# Patient Record
Sex: Female | Born: 1980 | Race: Black or African American | Hispanic: No | Marital: Married | Smoking: Never smoker
Health system: Southern US, Community
[De-identification: ages and names within clinical notes are randomized; demographics above are authoritative.]

## PROBLEM LIST (undated history)

## (undated) DIAGNOSIS — Z789 Other specified health status: Secondary | ICD-10-CM

## (undated) HISTORY — PX: NO PAST SURGERIES: SHX2092

---

## 2004-03-25 ENCOUNTER — Other Ambulatory Visit: Admission: RE | Admit: 2004-03-25 | Discharge: 2004-03-25 | Payer: Self-pay | Admitting: Obstetrics and Gynecology

## 2007-07-23 ENCOUNTER — Inpatient Hospital Stay (HOSPITAL_COMMUNITY): Admission: AD | Admit: 2007-07-23 | Discharge: 2007-07-26 | Payer: Self-pay | Admitting: Obstetrics & Gynecology

## 2009-03-13 ENCOUNTER — Inpatient Hospital Stay (HOSPITAL_COMMUNITY): Admission: AD | Admit: 2009-03-13 | Discharge: 2009-03-15 | Payer: Self-pay | Admitting: Obstetrics & Gynecology

## 2010-11-30 NOTE — L&D Delivery Note (Signed)
Delivery Note At 4:22 PM a healthy female was delivered via Vaginal, Spontaneous Delivery (Presentation: Right Occiput Anterior).  APGAR:9 ,9 ; weight 8 lb 2.7 oz (3705 g).   Placenta status: Intact, Spontaneous.  Cord: 3 vessels with the following complications: None.  Cord pH: not done  Anesthesia: Epidural  Episiotomy: None Lacerations: 2nd degree Suture Repair: vicryl rapide Est. Blood Loss (mL): 400  Mom to postpartum.  Baby to nursery-stable.  Christie Copley,MARIE-LYNE 10/20/2011, 4:43 PM

## 2011-03-11 LAB — CBC
HCT: 40.2 % (ref 36.0–46.0)
Hemoglobin: 11.4 g/dL — ABNORMAL LOW (ref 12.0–15.0)
Hemoglobin: 13.9 g/dL (ref 12.0–15.0)
MCHC: 35.4 g/dL (ref 30.0–36.0)
MCV: 100.8 fL — ABNORMAL HIGH (ref 78.0–100.0)
Platelets: 143 10*3/uL — ABNORMAL LOW (ref 150–400)
RBC: 3.2 MIL/uL — ABNORMAL LOW (ref 3.87–5.11)
WBC: 10.2 10*3/uL (ref 4.0–10.5)

## 2011-04-16 LAB — ABO/RH

## 2011-04-16 LAB — RPR: RPR: NONREACTIVE

## 2011-04-16 LAB — HIV ANTIBODY (ROUTINE TESTING W REFLEX): HIV: NONREACTIVE

## 2011-09-11 LAB — CBC
HCT: 31.6 — ABNORMAL LOW
MCHC: 34.7
MCHC: 35.1
MCV: 96.4
MCV: 98.2
Platelets: 129 — ABNORMAL LOW
RBC: 3.85 — ABNORMAL LOW
RDW: 14

## 2011-09-11 LAB — RPR: RPR Ser Ql: NONREACTIVE

## 2011-10-20 ENCOUNTER — Encounter (HOSPITAL_COMMUNITY): Payer: Self-pay | Admitting: *Deleted

## 2011-10-20 ENCOUNTER — Inpatient Hospital Stay (HOSPITAL_COMMUNITY)
Admission: AD | Admit: 2011-10-20 | Discharge: 2011-10-22 | DRG: 775 | Disposition: A | Discharge status: Home / Self Care | Payer: 59 | Source: Ambulatory Visit | Attending: Obstetrics & Gynecology | Admitting: Obstetrics & Gynecology

## 2011-10-20 ENCOUNTER — Inpatient Hospital Stay (HOSPITAL_COMMUNITY): Payer: 59 | Admitting: Anesthesiology

## 2011-10-20 ENCOUNTER — Encounter (HOSPITAL_COMMUNITY): Payer: Self-pay | Admitting: Anesthesiology

## 2011-10-20 DIAGNOSIS — O480 Post-term pregnancy: Principal | ICD-10-CM | POA: Diagnosis present

## 2011-10-20 LAB — CBC
HCT: 36.5 % (ref 36.0–46.0)
Hemoglobin: 12.3 g/dL (ref 12.0–15.0)
MCV: 95.1 fL (ref 78.0–100.0)
Platelets: 167 10*3/uL (ref 150–400)
RBC: 3.84 MIL/uL — ABNORMAL LOW (ref 3.87–5.11)
WBC: 12.2 10*3/uL — ABNORMAL HIGH (ref 4.0–10.5)

## 2011-10-20 MED ORDER — ONDANSETRON HCL 4 MG/2ML IJ SOLN: 4.0000 mg | Freq: Four times a day (QID) | INTRAMUSCULAR | Status: DC | PRN

## 2011-10-20 MED ORDER — DIPHENHYDRAMINE HCL 50 MG/ML IJ SOLN: 12.5000 mg | INTRAMUSCULAR | Status: DC | PRN

## 2011-10-20 MED ORDER — SENNOSIDES-DOCUSATE SODIUM 8.6-50 MG PO TABS
2.0000 | ORAL_TABLET | Freq: Every day | ORAL | Status: DC
  Administered 2011-10-21: 2 via ORAL

## 2011-10-20 MED ORDER — WITCH HAZEL-GLYCERIN EX PADS
1.0000 "application " | MEDICATED_PAD | CUTANEOUS | Status: DC | PRN
  Administered 2011-10-20: 1 via TOPICAL

## 2011-10-20 MED ORDER — SIMETHICONE 80 MG PO CHEW: 80.0000 mg | CHEWABLE_TABLET | ORAL | Status: DC | PRN

## 2011-10-20 MED ORDER — LACTATED RINGERS IV SOLN
500.0000 mL | INTRAVENOUS | Status: DC | PRN
  Administered 2011-10-20: 1000 mL via INTRAVENOUS

## 2011-10-20 MED ORDER — OXYTOCIN 20 UNITS IN LACTATED RINGERS INFUSION - SIMPLE
1.0000 m[IU]/min | INTRAVENOUS | Status: DC
  Administered 2011-10-20: 2 m[IU]/min via INTRAVENOUS

## 2011-10-20 MED ORDER — LACTATED RINGERS IV SOLN
500.0000 mL | Freq: Once | INTRAVENOUS | Status: AC
  Administered 2011-10-20: 500 mL via INTRAVENOUS

## 2011-10-20 MED ORDER — PRENATAL PLUS 27-1 MG PO TABS
1.0000 | ORAL_TABLET | Freq: Every day | ORAL | Status: DC
  Administered 2011-10-21 – 2011-10-22 (×2): 1 via ORAL
  Filled 2011-10-20 (×2): qty 1

## 2011-10-20 MED ORDER — OXYCODONE-ACETAMINOPHEN 5-325 MG PO TABS
1.0000 | ORAL_TABLET | ORAL | Status: DC | PRN
  Administered 2011-10-21: 1 via ORAL
  Filled 2011-10-20: qty 1

## 2011-10-20 MED ORDER — IBUPROFEN 600 MG PO TABS
600.0000 mg | ORAL_TABLET | Freq: Four times a day (QID) | ORAL | Status: DC
  Administered 2011-10-20 – 2011-10-22 (×7): 600 mg via ORAL
  Filled 2011-10-20 (×8): qty 1

## 2011-10-20 MED ORDER — LIDOCAINE HCL 1.5 % IJ SOLN
INTRAMUSCULAR | Status: DC | PRN
  Administered 2011-10-20: 5 mL via INTRADERMAL
  Administered 2011-10-20: 2 mL via INTRADERMAL
  Administered 2011-10-20: 5 mL via INTRADERMAL

## 2011-10-20 MED ORDER — ONDANSETRON HCL 4 MG/2ML IJ SOLN: 4.0000 mg | INTRAMUSCULAR | Status: DC | PRN

## 2011-10-20 MED ORDER — FLEET ENEMA 7-19 GM/118ML RE ENEM: 1.0000 | ENEMA | RECTAL | Status: DC | PRN

## 2011-10-20 MED ORDER — ONDANSETRON HCL 4 MG PO TABS: 4.0000 mg | ORAL_TABLET | ORAL | Status: DC | PRN

## 2011-10-20 MED ORDER — BENZOCAINE-MENTHOL 20-0.5 % EX AERO
INHALATION_SPRAY | CUTANEOUS | Status: AC
  Administered 2011-10-20: 1 via TOPICAL
  Filled 2011-10-20: qty 56

## 2011-10-20 MED ORDER — TERBUTALINE SULFATE 1 MG/ML IJ SOLN: 0.2500 mg | Freq: Once | INTRAMUSCULAR | Status: DC | PRN

## 2011-10-20 MED ORDER — LIDOCAINE HCL (PF) 1 % IJ SOLN
30.0000 mL | INTRAMUSCULAR | Status: DC | PRN
  Filled 2011-10-20: qty 30

## 2011-10-20 MED ORDER — ZOLPIDEM TARTRATE 5 MG PO TABS: 5.0000 mg | ORAL_TABLET | Freq: Every evening | ORAL | Status: DC | PRN

## 2011-10-20 MED ORDER — LANOLIN HYDROUS EX OINT: TOPICAL_OINTMENT | CUTANEOUS | Status: DC | PRN

## 2011-10-20 MED ORDER — EPHEDRINE 5 MG/ML INJ: 10.0000 mg | INTRAVENOUS | Status: DC | PRN

## 2011-10-20 MED ORDER — DIPHENHYDRAMINE HCL 25 MG PO CAPS: 25.0000 mg | ORAL_CAPSULE | Freq: Four times a day (QID) | ORAL | Status: DC | PRN

## 2011-10-20 MED ORDER — CITRIC ACID-SODIUM CITRATE 334-500 MG/5ML PO SOLN: 30.0000 mL | ORAL | Status: DC | PRN

## 2011-10-20 MED ORDER — OXYTOCIN BOLUS FROM INFUSION
500.0000 mL | Freq: Once | INTRAVENOUS | Status: DC
  Filled 2011-10-20: qty 1000
  Filled 2011-10-20: qty 500

## 2011-10-20 MED ORDER — TETANUS-DIPHTH-ACELL PERTUSSIS 5-2.5-18.5 LF-MCG/0.5 IM SUSP
0.5000 mL | Freq: Once | INTRAMUSCULAR | Status: AC
  Administered 2011-10-21: 0.5 mL via INTRAMUSCULAR
  Filled 2011-10-20 (×2): qty 0.5

## 2011-10-20 MED ORDER — LACTATED RINGERS IV SOLN
INTRAVENOUS | Status: DC
  Administered 2011-10-20: 10:00:00 via INTRAVENOUS

## 2011-10-20 MED ORDER — FENTANYL 2.5 MCG/ML BUPIVACAINE 1/10 % EPIDURAL INFUSION (WH - ANES)
14.0000 mL/h | INTRAMUSCULAR | Status: DC
  Administered 2011-10-20: 14 mL/h via EPIDURAL
  Filled 2011-10-20: qty 60

## 2011-10-20 MED ORDER — BENZOCAINE-MENTHOL 20-0.5 % EX AERO
1.0000 "application " | INHALATION_SPRAY | CUTANEOUS | Status: DC | PRN
  Administered 2011-10-20: 1 via TOPICAL
  Filled 2011-10-20: qty 56

## 2011-10-20 MED ORDER — PHENYLEPHRINE 40 MCG/ML (10ML) SYRINGE FOR IV PUSH (FOR BLOOD PRESSURE SUPPORT)
80.0000 ug | PREFILLED_SYRINGE | INTRAVENOUS | Status: DC | PRN
  Filled 2011-10-20: qty 5

## 2011-10-20 MED ORDER — DIBUCAINE 1 % RE OINT
1.0000 "application " | TOPICAL_OINTMENT | RECTAL | Status: DC | PRN
  Administered 2011-10-21: 1 via RECTAL
  Filled 2011-10-20 (×2): qty 28

## 2011-10-20 MED ORDER — EPHEDRINE 5 MG/ML INJ
10.0000 mg | INTRAVENOUS | Status: DC | PRN
  Filled 2011-10-20: qty 4

## 2011-10-20 MED ORDER — PHENYLEPHRINE 40 MCG/ML (10ML) SYRINGE FOR IV PUSH (FOR BLOOD PRESSURE SUPPORT): 80.0000 ug | PREFILLED_SYRINGE | INTRAVENOUS | Status: DC | PRN

## 2011-10-20 MED ORDER — IBUPROFEN 600 MG PO TABS: 600.0000 mg | ORAL_TABLET | Freq: Four times a day (QID) | ORAL | Status: DC | PRN

## 2011-10-20 MED ORDER — OXYCODONE-ACETAMINOPHEN 5-325 MG PO TABS: 2.0000 | ORAL_TABLET | ORAL | Status: DC | PRN

## 2011-10-20 MED ORDER — OXYTOCIN 20 UNITS IN LACTATED RINGERS INFUSION - SIMPLE: 125.0000 mL/h | Freq: Once | INTRAVENOUS | Status: DC

## 2011-10-20 MED ORDER — ACETAMINOPHEN 325 MG PO TABS: 650.0000 mg | ORAL_TABLET | ORAL | Status: DC | PRN

## 2011-10-20 NOTE — Anesthesia Preprocedure Evaluation (Signed)
Anesthesia Evaluation  Patient identified by MRN, date of birth, ID band Patient awake    Reviewed: Allergy & Precautions, H&P , NPO status , Patient's Chart, lab work & pertinent test results, reviewed documented beta blocker date and time   Airway Mallampati: II TM Distance: >3 FB Neck ROM: full    Dental  (+) Teeth Intact   Pulmonary neg pulmonary ROS,  clear to auscultation        Cardiovascular neg cardio ROS regular Normal    Neuro/Psych Negative Neurological ROS  Negative Psych ROS   GI/Hepatic negative GI ROS, Neg liver ROS,   Endo/Other  Negative Endocrine ROS  Renal/GU negative Renal ROS  Genitourinary negative   Musculoskeletal   Abdominal   Peds  Hematology negative hematology ROS (+)   Anesthesia Other Findings   Reproductive/Obstetrics (+) Pregnancy                           Anesthesia Physical Anesthesia Plan  ASA: II  Anesthesia Plan: Epidural   Post-op Pain Management:    Induction:   Airway Management Planned:   Additional Equipment:   Intra-op Plan:   Post-operative Plan:   Informed Consent: I have reviewed the patients History and Physical, chart, labs and discussed the procedure including the risks, benefits and alternatives for the proposed anesthesia with the patient or authorized representative who has indicated his/her understanding and acceptance.     Plan Discussed with:   Anesthesia Plan Comments:         Anesthesia Quick Evaluation

## 2011-10-20 NOTE — H&P (Signed)
Cassandra Bright is a 30 y.o. female [id number] [redacted]w[redacted]d presenting for induction post term.  OB History    Grav Para Term Preterm Abortions TAB SAB Ect Mult Living   3 2 2  0 0 0 0 0 0 2     No past medical history on file. No past surgical history on file. Family History: family history is not on file. Social History:  reports that she has never smoked. She does not have any smokeless tobacco history on file. She reports that she does not drink alcohol or use illicit drugs. Current facility-administered medications:acetaminophen (TYLENOL) tablet 650 mg, 650 mg, Oral, Q4H PRN, Marie-Lyne Hudsyn Barich;  citric acid-sodium citrate (ORACIT) solution 30 mL, 30 mL, Oral, Q2H PRN, Marie-Lyne Kysen Wetherington;  ibuprofen (ADVIL,MOTRIN) tablet 600 mg, 600 mg, Oral, Q6H PRN, Marie-Lyne Khaiden Segreto;  lactated ringers infusion 500-1,000 mL, 500-1,000 mL, Intravenous, PRN, Marie-Lyne Jian Hodgman lactated ringers infusion, , Intravenous, Continuous, Marie-Lyne Jenina Moening;  lidocaine (XYLOCAINE) 1 % injection 30 mL, 30 mL, Subcutaneous, PRN, Marie-Lyne Anntonette Madewell;  ondansetron (ZOFRAN) injection 4 mg, 4 mg, Intravenous, Q6H PRN, Marie-Lyne Carlin Attridge;  oxyCODONE-acetaminophen (PERCOCET) 5-325 MG per tablet 2 tablet, 2 tablet, Oral, Q3H PRN, Marie-Lyne Yarenis Cerino;  oxytocin (PITOCIN) IV BOLUS FROM BAG, 500 mL, Intravenous, Once, Marie-Lyne Georgianne Gritz oxytocin (PITOCIN) IV infusion 20 units in LR 1000 mL, 125 mL/hr, Intravenous, Once, Marie-Lyne Maegen Wigle;  oxytocin (PITOCIN) IV infusion 20 units in LR 1000 mL, 1-40 milli-units/min, Intravenous, Titrated, Marie-Lyne Luxe Cuadros;  sodium phosphate (FLEET) 7-19 GM/118ML enema 1 enema, 1 enema, Rectal, PRN, Marie-Lyne Louden Houseworth;  terbutaline (BRETHINE) injection 0.25 mg, 0.25 mg, Subcutaneous, Once PRN, Marie-Lyne Roselynn Whitacre No Known Allergies    Blood pressure 109/70, pulse 90, resp. rate 20, height 5\' 7"  (1.702 m), weight 73.029 kg (161 lb).  VE 4/90/Vtx/0 membranes intact  HPP: There is no problem list on file for this  patient.   Prenatal labs: ABO, Rh: O/Positive/-- (05/17 0000) Antibody: Negative (05/17 0000) Rubella:  Imm RPR: Nonreactive (05/17 0000)  HBsAg: Negative (05/17 0000)  HIV: Non-reactive (05/17 0000)  Genetic testing: Declined [place] anato: wnl 1 hr GTT: wnl GBS: Negative (10/11 0000)   Assessment/Plan: 41 2/7 wks G3P2 normal pregnancy.  GBS neg.  Advanced cervical dilatation. For induction post dates Pito low dose/AROM/monitoring.   Myer Bohlman,MARIE-LYNE 10/20/2011, 11:09 AM

## 2011-10-20 NOTE — Anesthesia Procedure Notes (Addendum)
Epidural Patient location during procedure: OB Start time: 10/20/2011 2:14 PM Reason for block: procedure for pain  Staffing Performed by: anesthesiologist   Preanesthetic Checklist Completed: patient identified, site marked, surgical consent, pre-op evaluation, timeout performed, IV checked, risks and benefits discussed and monitors and equipment checked  Epidural Patient position: sitting Prep: site prepped and draped and DuraPrep Patient monitoring: continuous pulse ox and blood pressure Approach: midline Injection technique: LOR air  Needle:  Needle type: Tuohy  Needle gauge: 17 G Needle length: 9 cm Catheter type: closed end flexible Catheter size: 19 Gauge Test dose: negative  Assessment Events: blood not aspirated, injection not painful, no injection resistance, negative IV test and no paresthesia  Additional Notes Discussed risk of headache, infection, bleeding, nerve injury and failed or incomplete block.  Patient voices understanding and wishes to proceed.

## 2011-10-20 NOTE — Progress Notes (Signed)
Feeling minor ctx, no LOF, no VB  History briefly reveiwed. G3P2 >41 wks, proven 8'9, no medical problems, GBS neg, no complications in this preg,  Toco: irreg, pit at 2 munits/ min FH: 140's, + accels, no decels, 10 beat var Cvx: 4/80%/ vtx -3, AROM clear, blood tinged  A/P: IOL post-dates. Cont pitocin FWB reasurring  Camber Ninh A. 10/20/2011 12:08 PM

## 2011-10-21 ENCOUNTER — Encounter (HOSPITAL_COMMUNITY): Payer: Self-pay

## 2011-10-21 LAB — CBC
MCH: 32.3 pg (ref 26.0–34.0)
MCHC: 33.6 g/dL (ref 30.0–36.0)
MCV: 96.1 fL (ref 78.0–100.0)
Platelets: 162 10*3/uL (ref 150–400)
RDW: 14.1 % (ref 11.5–15.5)

## 2011-10-21 NOTE — Progress Notes (Signed)
Patient ID: Lodema Hong, female   DOB: 1981-11-24, 30 y.o.   MRN: [id number] PPD 1 SVD  S:  Reports feeling well., mod perineal pain             Tolerating po/ No nausea or vomiting             Bleeding is moderate             Pain controlled withprescription NSAID's including motrin, declines percocet             Up ad lib / ambulatory  Newborn  Information for the patient's newborn:  Nabi, Girl Jerriyah [[id number]]  female  breast and bottle feeding , experienced mom, BF x 21 and 18 mo older children O:  A & O x 3 NAD             VS: Blood pressure 64/53, pulse 98, temperature 98.3 F (36.8 C), temperature source Oral, resp. rate 20, height 5\' 7"  (1.702 m), weight 73.029 kg (161 lb), SpO2 98.00%, unknown if currently breastfeeding.  LABS:  Basename 10/21/11 0517 10/20/11 1030  HGB 10.7* 12.3  HCT 31.8* 36.5    I&O: I/O last 3 completed shifts: In: -  Out: 400 [Blood:400]      Lungs: Clear and unlabored  Heart: regular rate and rhythm / no mumurs  Abdomen: soft, non-tender, non-distended              Fundus: firm, non-tender, U@  Perineum: repair intact, small hemorrhoid  Lochia: small  Extremities: no edema, no calf pain or tenderness, neg Homans    A/P: PPD # 1 30 y.o., [id number] S/P:spontaneous vaginal   Principal Problem:  *Postpartum care following vaginal delivery (11/20)   Doing well - stable status  Routine post partum orders  Anticipate discharge home in AM.   PAUL,DANIELA, CNM, MSN 10/21/2011, 8:47 AM

## 2011-10-21 NOTE — Anesthesia Postprocedure Evaluation (Signed)
  Anesthesia Post-op Note  Patient: Cassandra Bright  Procedure(s) Performed: * No procedures listed *  Patient Location:   Anesthesia Type: Epidural  Level of Consciousness: awake, alert  and oriented  Airway and Oxygen Therapy: Patient Spontanous Breathing  Post-op Pain: mild  Post-op Assessment: Post-op Vital signs reviewed, Patient's Cardiovascular Status Stable, No headache, No backache, No residual numbness and No residual motor weakness  Post-op Vital Signs: Reviewed and stable  Complications: No apparent anesthesia complications

## 2011-10-22 MED ORDER — IBUPROFEN 600 MG PO TABS: 600.0000 mg | ORAL_TABLET | Freq: Four times a day (QID) | ORAL | Status: AC

## 2011-10-22 NOTE — Discharge Summary (Signed)
Obstetric Discharge Summary Reason for Admission: onset of labor Prenatal Procedures: none Intrapartum Procedures: spontaneous vaginal delivery Postpartum Procedures: none Complications-Operative and Postpartum: none Hemoglobin  Date Value Range Status  10/21/2011 10.7* 12.0-15.0 (g/dL) Final     HCT  Date Value Range Status  10/21/2011 31.8* 36.0-46.0 (%) Final    Discharge Diagnoses: Term Pregnancy-delivered  Discharge Information: Date: 10/22/2011 Activity: pelvic rest Diet: routine Medications: PNV and Ibuprofen Condition: stable Instructions: refer to practice specific booklet Discharge to: home   Newborn Data: Live born female  Birth Weight: 8 lb 2.7 oz (3705 g) APGAR: ,   Home with mother.  Cassandra Bright 10/22/2011, 10:27 AM

## 2011-10-22 NOTE — Progress Notes (Signed)
Patient ID: Cassandra Bright, female   DOB: [birth year], 30 y.o.   MRN: [id number]  PPD 2 SVD  S:  Reports feeling well             Tolerating po/ No nausea or vomiting             Bleeding is light             Pain controlled withprescription NSAID's including motrin and percocet             Up ad lib / ambulatory  Newborn breast feeding  / female newborn   O:  A & O x 3              VS: Blood pressure 103/70, pulse 92, temperature 98.3 F (36.8 C), temperature source Oral, resp. rate 18, height 5\' 7"  (1.702 m), weight 73.029 kg (161 lb), SpO2 98.00%, unknown if currently breastfeeding.  LABS: Lab Results  Component Value Date   WBC 15.1* 10/21/2011   HGB 10.7* 10/21/2011   HCT 31.8* 10/21/2011   MCV 96.1 10/21/2011   PLT 162 10/21/2011     Lungs: Clear and unlabored  Heart: regular rate and rhythm / no mumurs  Abdomen: soft, non-tender, non-distended              Fundus: firm, non-tender, U-2  Perineum: mild edema  Lochia: light  Extremities: no edema, no calf pain or tenderness    A: PPD # 2   Doing well - stable status  P:  Routine post partum orders  Discharge home  [facility] 10/22/2011, 9:24 AM

## 2011-11-02 ENCOUNTER — Encounter (HOSPITAL_COMMUNITY): Payer: Self-pay | Admitting: *Deleted

## 2012-11-30 NOTE — L&D Delivery Note (Signed)
Delivery Note At 12:13 PM a healthy female was delivered via Vaginal, Spontaneous Delivery (Presentation: Left Occiput Anterior).  APGAR: 9, 9; weight .   Placenta status: Intact, Spontaneous.  Cord: 3 vessels with the following complications: None.  Cord pH: not done  Anesthesia: Epidural  Episiotomy: None Lacerations: 1st degree;Perineal Suture Repair: vicryl rapide 4-0 Est. Blood Loss (mL):  300 Count of sponges complete.  Mom to postpartum.  Baby to nursery-stable. Circ consent obtained and signed.  BB had a miction.    Lynn Sissel,MARIE-LYNE 07/27/2013, 12:29 PM

## 2013-07-27 ENCOUNTER — Encounter (HOSPITAL_COMMUNITY): Payer: Self-pay | Admitting: *Deleted

## 2013-07-27 ENCOUNTER — Inpatient Hospital Stay (HOSPITAL_COMMUNITY): Payer: 59 | Admitting: Anesthesiology

## 2013-07-27 ENCOUNTER — Inpatient Hospital Stay (HOSPITAL_COMMUNITY)
Admission: AD | Admit: 2013-07-27 | Discharge: 2013-07-28 | DRG: 775 | Disposition: A | Discharge status: Home / Self Care | Payer: 59 | Source: Ambulatory Visit | Attending: Obstetrics and Gynecology | Admitting: Obstetrics and Gynecology

## 2013-07-27 ENCOUNTER — Encounter (HOSPITAL_COMMUNITY): Payer: Self-pay | Admitting: Anesthesiology

## 2013-07-27 HISTORY — DX: Other specified health status: Z78.9

## 2013-07-27 LAB — RPR: RPR Ser Ql: NONREACTIVE

## 2013-07-27 LAB — CBC
MCHC: 34.4 g/dL (ref 30.0–36.0)
RDW: 13.8 % (ref 11.5–15.5)

## 2013-07-27 LAB — OB RESULTS CONSOLE RPR: RPR: NONREACTIVE

## 2013-07-27 LAB — OB RESULTS CONSOLE HIV ANTIBODY (ROUTINE TESTING): HIV: NONREACTIVE

## 2013-07-27 MED ORDER — FENTANYL 2.5 MCG/ML BUPIVACAINE 1/10 % EPIDURAL INFUSION (WH - ANES)
INTRAMUSCULAR | Status: DC | PRN
  Administered 2013-07-27: 14 mL/h via EPIDURAL

## 2013-07-27 MED ORDER — DIBUCAINE 1 % RE OINT: 1.0000 "application " | TOPICAL_OINTMENT | RECTAL | Status: DC | PRN

## 2013-07-27 MED ORDER — IBUPROFEN 600 MG PO TABS
600.0000 mg | ORAL_TABLET | Freq: Four times a day (QID) | ORAL | Status: DC
  Administered 2013-07-27 – 2013-07-28 (×5): 600 mg via ORAL
  Filled 2013-07-27 (×6): qty 1

## 2013-07-27 MED ORDER — ONDANSETRON HCL 4 MG/2ML IJ SOLN: 4.0000 mg | INTRAMUSCULAR | Status: DC | PRN

## 2013-07-27 MED ORDER — PHENYLEPHRINE 40 MCG/ML (10ML) SYRINGE FOR IV PUSH (FOR BLOOD PRESSURE SUPPORT): 80.0000 ug | PREFILLED_SYRINGE | INTRAVENOUS | Status: DC | PRN

## 2013-07-27 MED ORDER — PHENYLEPHRINE 40 MCG/ML (10ML) SYRINGE FOR IV PUSH (FOR BLOOD PRESSURE SUPPORT)
PREFILLED_SYRINGE | INTRAVENOUS | Status: AC
  Filled 2013-07-27: qty 5

## 2013-07-27 MED ORDER — LACTATED RINGERS IV SOLN
500.0000 mL | Freq: Once | INTRAVENOUS | Status: AC
  Administered 2013-07-27: 500 mL via INTRAVENOUS

## 2013-07-27 MED ORDER — LIDOCAINE HCL (PF) 1 % IJ SOLN
30.0000 mL | INTRAMUSCULAR | Status: DC | PRN
  Filled 2013-07-27: qty 30

## 2013-07-27 MED ORDER — ACETAMINOPHEN 325 MG PO TABS: 650.0000 mg | ORAL_TABLET | ORAL | Status: DC | PRN

## 2013-07-27 MED ORDER — CITRIC ACID-SODIUM CITRATE 334-500 MG/5ML PO SOLN: 30.0000 mL | ORAL | Status: DC | PRN

## 2013-07-27 MED ORDER — PRENATAL MULTIVITAMIN CH
1.0000 | ORAL_TABLET | Freq: Every day | ORAL | Status: DC
  Administered 2013-07-27 – 2013-07-28 (×2): 1 via ORAL
  Filled 2013-07-27 (×2): qty 1

## 2013-07-27 MED ORDER — OXYCODONE-ACETAMINOPHEN 5-325 MG PO TABS: 1.0000 | ORAL_TABLET | ORAL | Status: DC | PRN

## 2013-07-27 MED ORDER — LANOLIN HYDROUS EX OINT: TOPICAL_OINTMENT | CUTANEOUS | Status: DC | PRN

## 2013-07-27 MED ORDER — LIDOCAINE HCL (PF) 1 % IJ SOLN
INTRAMUSCULAR | Status: DC | PRN
  Administered 2013-07-27 (×2): 4 mL

## 2013-07-27 MED ORDER — WITCH HAZEL-GLYCERIN EX PADS: 1.0000 "application " | MEDICATED_PAD | CUTANEOUS | Status: DC | PRN

## 2013-07-27 MED ORDER — DIPHENHYDRAMINE HCL 25 MG PO CAPS: 25.0000 mg | ORAL_CAPSULE | Freq: Four times a day (QID) | ORAL | Status: DC | PRN

## 2013-07-27 MED ORDER — OXYTOCIN BOLUS FROM INFUSION
500.0000 mL | INTRAVENOUS | Status: DC
  Administered 2013-07-27: 500 mL via INTRAVENOUS

## 2013-07-27 MED ORDER — SIMETHICONE 80 MG PO CHEW: 80.0000 mg | CHEWABLE_TABLET | ORAL | Status: DC | PRN

## 2013-07-27 MED ORDER — LACTATED RINGERS IV SOLN
INTRAVENOUS | Status: DC
  Administered 2013-07-27 (×2): via INTRAVENOUS

## 2013-07-27 MED ORDER — OXYTOCIN 40 UNITS IN LACTATED RINGERS INFUSION - SIMPLE MED
62.5000 mL/h | INTRAVENOUS | Status: DC
  Filled 2013-07-27: qty 1000

## 2013-07-27 MED ORDER — IBUPROFEN 600 MG PO TABS: 600.0000 mg | ORAL_TABLET | Freq: Four times a day (QID) | ORAL | Status: DC | PRN

## 2013-07-27 MED ORDER — OXYTOCIN 40 UNITS IN LACTATED RINGERS INFUSION - SIMPLE MED: 62.5000 mL/h | INTRAVENOUS | Status: DC | PRN

## 2013-07-27 MED ORDER — ZOLPIDEM TARTRATE 5 MG PO TABS: 5.0000 mg | ORAL_TABLET | Freq: Every evening | ORAL | Status: DC | PRN

## 2013-07-27 MED ORDER — EPHEDRINE 5 MG/ML INJ: 10.0000 mg | INTRAVENOUS | Status: DC | PRN

## 2013-07-27 MED ORDER — EPHEDRINE 5 MG/ML INJ
INTRAVENOUS | Status: AC
  Filled 2013-07-27: qty 4

## 2013-07-27 MED ORDER — FLEET ENEMA 7-19 GM/118ML RE ENEM: 1.0000 | ENEMA | RECTAL | Status: DC | PRN

## 2013-07-27 MED ORDER — DIPHENHYDRAMINE HCL 50 MG/ML IJ SOLN
12.5000 mg | INTRAMUSCULAR | Status: DC | PRN
Start: 2013-07-27 — End: 2013-07-27

## 2013-07-27 MED ORDER — FENTANYL 2.5 MCG/ML BUPIVACAINE 1/10 % EPIDURAL INFUSION (WH - ANES)
14.0000 mL/h | INTRAMUSCULAR | Status: DC | PRN
  Administered 2013-07-27: 14 mL/h via EPIDURAL
  Filled 2013-07-27: qty 125

## 2013-07-27 MED ORDER — ONDANSETRON HCL 4 MG/2ML IJ SOLN: 4.0000 mg | Freq: Four times a day (QID) | INTRAMUSCULAR | Status: DC | PRN

## 2013-07-27 MED ORDER — TETANUS-DIPHTH-ACELL PERTUSSIS 5-2.5-18.5 LF-MCG/0.5 IM SUSP: 0.5000 mL | Freq: Once | INTRAMUSCULAR | Status: DC

## 2013-07-27 MED ORDER — FENTANYL 2.5 MCG/ML BUPIVACAINE 1/10 % EPIDURAL INFUSION (WH - ANES)
INTRAMUSCULAR | Status: AC
  Filled 2013-07-27: qty 125

## 2013-07-27 MED ORDER — BENZOCAINE-MENTHOL 20-0.5 % EX AERO: 1.0000 "application " | INHALATION_SPRAY | CUTANEOUS | Status: DC | PRN

## 2013-07-27 MED ORDER — SENNOSIDES-DOCUSATE SODIUM 8.6-50 MG PO TABS
2.0000 | ORAL_TABLET | Freq: Every day | ORAL | Status: DC
  Administered 2013-07-27: 2 via ORAL

## 2013-07-27 MED ORDER — LACTATED RINGERS IV SOLN: 500.0000 mL | INTRAVENOUS | Status: DC | PRN

## 2013-07-27 MED ORDER — ONDANSETRON HCL 4 MG PO TABS: 4.0000 mg | ORAL_TABLET | ORAL | Status: DC | PRN

## 2013-07-27 NOTE — Anesthesia Preprocedure Evaluation (Signed)

## 2013-07-27 NOTE — Anesthesia Postprocedure Evaluation (Signed)
  Anesthesia Post-op Note  Patient: Cassandra Bright  Procedure(s) Performed: * No procedures listed *  Patient Location: Mother/Baby  Anesthesia Type:Epidural  Level of Consciousness: awake  Airway and Oxygen Therapy: Patient Spontanous Breathing  Post-op Pain: mild  Post-op Assessment: Patient's Cardiovascular Status Stable and Respiratory Function Stable  Post-op Vital Signs: stable  Complications: No apparent anesthesia complications

## 2013-07-27 NOTE — Anesthesia Procedure Notes (Signed)
Epidural Patient location during procedure: OB Start time: 07/27/2013 8:16 AM  Staffing Anesthesiologist: Cliford Sequeira A. Performed by: anesthesiologist   Preanesthetic Checklist Completed: patient identified, site marked, surgical consent, pre-op evaluation, timeout performed, IV checked, risks and benefits discussed and monitors and equipment checked  Epidural Patient position: sitting Prep: site prepped and draped and DuraPrep Patient monitoring: continuous pulse ox and blood pressure Approach: midline Injection technique: LOR air  Needle:  Needle type: Tuohy  Needle gauge: 17 G Needle length: 9 cm and 9 Needle insertion depth: 4 cm Catheter type: closed end flexible Catheter size: 19 Gauge Catheter at skin depth: 9 cm Test dose: negative  Assessment Events: blood not aspirated, injection not painful, no injection resistance, negative IV test and no paresthesia  Additional Notes Patient identified. Risks and benefits discussed including failed block, incomplete  Pain control, post dural puncture headache, nerve damage, paralysis, blood pressure Changes, nausea, vomiting, reactions to medications-both toxic and allergic and post Partum back pain. All questions were answered. Patient expressed understanding and wished to proceed. Sterile technique was used throughout procedure. Epidural site was Dressed with sterile barrier dressing. No paresthesias, signs of intravascular injection Or signs of intrathecal spread were encountered.  Patient was more comfortable after the epidural was dosed. Please see RN's note for documentation of vital signs and FHR which are stable.

## 2013-07-27 NOTE — MAU Note (Signed)
Contractions 5-6 mins apart since 9 pm.

## 2013-07-27 NOTE — MAU Provider Note (Signed)
Cassandra Bright is a 32 y.o. female presenting for active labor. History OB History   Grav Para Term Preterm Abortions TAB SAB Ect Mult Living   4 3 3  0 0 0 0 0 0 3     Past Medical History  Diagnosis Date  . Postpartum care following vaginal delivery (11/20) 10/21/2011   No past surgical history on file. Family History: see prenatal Social History:  reports that she has never smoked. She does not have any smokeless tobacco history on file. She reports that she does not drink alcohol or use illicit drugs.   Prenatal Transfer Tool  Maternal Diabetes: No Genetic Screening: Normal Maternal Ultrasounds/Referrals: Normal Fetal Ultrasounds or other Referrals:  None Maternal Substance Abuse:  No Significant Maternal Medications:  None Significant Maternal Lab Results:  None Other Comments:  None  ROS: neg NCAT Neck : supple Lgs: clear CV: RRR Abd: Gravid, NT Ext: nl Skin: intact  Dilation: 6 Effacement (%): 70 Station: -1 Exam by:: L. Munford RN Blood pressure 111/73, pulse 103, temperature 98.3 F (36.8 C), temperature source Oral, resp. rate 20, height 5\' 7"  (1.702 m), weight 76.204 kg (168 lb), unknown if currently breastfeeding. Exam Physical Exam  Prenatal labs:- see attached prenatal ABO, Rh:   Antibody:   Rubella:   RPR:    HBsAg:    HIV:    GBS:     Assessment/Plan: Active labor at term Epidural Expect SVD   Peggyann Zwiefelhofer J 07/27/2013, 7:20 AM

## 2013-07-28 LAB — CBC
HCT: 34.7 % — ABNORMAL LOW (ref 36.0–46.0)
Hemoglobin: 11.6 g/dL — ABNORMAL LOW (ref 12.0–15.0)
MCH: 31.5 pg (ref 26.0–34.0)
MCV: 94.3 fL (ref 78.0–100.0)
RBC: 3.68 MIL/uL — ABNORMAL LOW (ref 3.87–5.11)

## 2013-07-28 MED ORDER — IBUPROFEN 600 MG PO TABS: 600.0000 mg | ORAL_TABLET | Freq: Four times a day (QID) | ORAL | Status: DC

## 2013-07-28 NOTE — Progress Notes (Signed)
PPD #1- SVD  Subjective:   Reports feeling good Tolerating po/ No nausea or vomiting Bleeding is light Pain controlled with Motrin Up ad lib / ambulatory / voiding without problems Newborn: breastfeeding  / Circumcision: planning   Objective:   WU:JWJX:  [97.8 F (36.6 C)-99.8 F (37.7 C)] 97.8 F (36.6 C) (08/29 0535) Pulse Rate:  [79-97] 80 (08/29 0535) Resp:  [16-18] 16 (08/29 0535) BP: (92-123)/(51-76) 103/60 mmHg (08/29 0535) SpO2:  [99 %] 99 % (08/28 2340)  LABS:  Recent Labs  07/27/13 0715 07/28/13 0615  WBC 13.0* 12.3*  HGB 11.5* 11.6*  PLT 172 181   Blood type:  O pos Rubella:   Immune  I&O: Intake/Output     08/28 0701 - 08/29 0700 08/29 0701 - 08/30 0700   Blood 300    Total Output 300     Net -300            Physical Exam: Alert and oriented x3 Abdomen: soft, non-tender, non-distended  Fundus: firm, non-tender, U-1 Perineum: Well approximated, no significant erythema, edema, or drainage; healing well. Lochia: small Extremities: no edema, no calf pain or tenderness    Assessment:  PPD # [id number]/ S/P:spontaneous vaginal, 1st degree laceration  Doing well    Plan: Continue routine post partum orders May plan for early discharge later today   Donette Larry, N MSN, CNM 07/28/2013, 9:43 AM

## 2013-07-28 NOTE — Discharge Summary (Signed)
Obstetric Discharge Summary Reason for Admission: onset of labor Prenatal Procedures: none Intrapartum Procedures: spontaneous vaginal delivery and epidural Postpartum Procedures: none Complications-Operative and Postpartum: 1st degree perineal laceration Hemoglobin  Date Value Range Status  07/28/2013 11.6* 12.0 - 15.0 g/dL Final     HCT  Date Value Range Status  07/28/2013 34.7* 36.0 - 46.0 % Final    Physical Exam:  General: alert and cooperative Lochia: appropriate Uterine Fundus: firm Incision: healing well, no significant drainage, no dehiscence, no significant erythema DVT Evaluation: No evidence of DVT seen on physical exam. Negative Homan's sign.  Discharge Diagnoses: Term Pregnancy-delivered  Discharge Information: Date: 07/28/2013 Activity: pelvic rest Diet: routine Medications: PNV and Ibuprofen Condition: stable Instructions: refer to practice specific booklet Discharge to: home Follow-up Information   Follow up with LAVOIE,MARIE-LYNE, MD. Schedule an appointment as soon as possible for a visit in 6 weeks.   Specialty:  Obstetrics and Gynecology   Contact information:   [address] [place] [address] [phone]       Newborn Data: Live born female on 07/27/13 Birth Weight: 7 lb 3.2 oz (3266 g) APGAR: 9, 9  Home with mother.  Cassandra Bright, N 07/28/2013, 5:56 PM

## 2014-10-01 ENCOUNTER — Encounter (HOSPITAL_COMMUNITY): Payer: Self-pay | Admitting: *Deleted

## 2018-06-03 ENCOUNTER — Other Ambulatory Visit: Payer: Self-pay

## 2018-06-03 ENCOUNTER — Encounter (HOSPITAL_BASED_OUTPATIENT_CLINIC_OR_DEPARTMENT_OTHER): Payer: Self-pay

## 2018-06-03 ENCOUNTER — Emergency Department (HOSPITAL_BASED_OUTPATIENT_CLINIC_OR_DEPARTMENT_OTHER)
Admission: EM | Admit: 2018-06-03 | Discharge: 2018-06-03 | Disposition: A | Discharge status: Home / Self Care | Payer: 59 | Attending: Emergency Medicine | Admitting: Emergency Medicine

## 2018-06-03 ENCOUNTER — Emergency Department (HOSPITAL_BASED_OUTPATIENT_CLINIC_OR_DEPARTMENT_OTHER): Payer: 59

## 2018-06-03 DIAGNOSIS — Y998 Other external cause status: Secondary | ICD-10-CM | POA: Diagnosis not present

## 2018-06-03 DIAGNOSIS — S62102A Fracture of unspecified carpal bone, left wrist, initial encounter for closed fracture: Secondary | ICD-10-CM

## 2018-06-03 DIAGNOSIS — W11XXXA Fall on and from ladder, initial encounter: Secondary | ICD-10-CM | POA: Insufficient documentation

## 2018-06-03 DIAGNOSIS — S52572A Other intraarticular fracture of lower end of left radius, initial encounter for closed fracture: Secondary | ICD-10-CM | POA: Insufficient documentation

## 2018-06-03 DIAGNOSIS — Y929 Unspecified place or not applicable: Secondary | ICD-10-CM | POA: Insufficient documentation

## 2018-06-03 DIAGNOSIS — Y9389 Activity, other specified: Secondary | ICD-10-CM | POA: Insufficient documentation

## 2018-06-03 DIAGNOSIS — Z79899 Other long term (current) drug therapy: Secondary | ICD-10-CM | POA: Insufficient documentation

## 2018-06-03 DIAGNOSIS — S6992XA Unspecified injury of left wrist, hand and finger(s), initial encounter: Secondary | ICD-10-CM | POA: Diagnosis present

## 2018-06-03 MED ORDER — HYDROCODONE-ACETAMINOPHEN 5-325 MG PO TABS: 1.0000 | ORAL_TABLET | Freq: Four times a day (QID) | ORAL | 0 refills | Status: AC | PRN

## 2018-06-03 MED ORDER — HYDROCODONE-ACETAMINOPHEN 5-325 MG PO TABS
2.0000 | ORAL_TABLET | Freq: Once | ORAL | Status: AC
  Administered 2018-06-03: 2 via ORAL
  Filled 2018-06-03: qty 2

## 2018-06-03 MED ORDER — IBUPROFEN 600 MG PO TABS: 600.0000 mg | ORAL_TABLET | Freq: Four times a day (QID) | ORAL | 0 refills | Status: AC | PRN

## 2018-06-03 NOTE — ED Provider Notes (Signed)
[facility] EMERGENCY DEPARTMENT Provider Note   CSN: [id number] Arrival date & time: 06/03/18  1853     History   Chief Complaint Chief Complaint  Patient presents with  . Wrist Injury    HPI Cassandra Bright is a 37 y.o. female who is previously healthy who presents with left wrist and hand pain after falling off a ladder.  Patient was helping her husband and handing him something up to the roof when she slipped and fell.  She caught her fall with her left wrist.  She describes a fall on outstretched hand.  She does not remember hitting her head or losing consciousness.  She denies any neck or back pain, or lower extremity pain.  No medications or interventions taken prior to arrival. Patient is right handed.  HPI  Past Medical History:  Diagnosis Date  . Medical history non-contributory   . Postpartum care following vaginal delivery (11/20) 10/21/2011  . Postpartum care following vaginal delivery (8/28) 07/27/2013    Patient Active Problem List   Diagnosis Date Noted  . Postpartum care following vaginal delivery (8/28) 07/27/2013    Past Surgical History:  Procedure Laterality Date  . NO PAST SURGERIES       OB History    Gravida  4   Para  4   Term  4   Preterm  0   AB  0   Living  4     SAB  0   TAB  0   Ectopic  0   Multiple  0   Live Births  2            Home Medications    Prior to Admission medications   Medication Sig Start Date End Date Taking? Authorizing Provider  HYDROcodone-acetaminophen (NORCO/VICODIN) 5-325 MG tablet Take 1-2 tablets by mouth every 6 (six) hours as needed. 06/03/18   Monti Jilek, Waylan BogaAlexandra M, PA-C  ibuprofen (ADVIL,MOTRIN) 600 MG tablet Take 1 tablet (600 mg total) by mouth every 6 (six) hours as needed. 06/03/18   Emi HolesLaw, Koleton Duchemin M, PA-C  Prenatal Vit-Fe Fumarate-FA (PRENATAL MULTIVITAMIN) TABS tablet Take 1 tablet by mouth daily at 12 noon.    [provider]    Family History No family history on  file.  Social History Social History   Tobacco Use  . Smoking status: Never Smoker  . Smokeless tobacco: Never Used  Substance Use Topics  . Alcohol use: No  . Drug use: No     Allergies   Patient has no known allergies.   Review of Systems Review of Systems  Musculoskeletal: Positive for arthralgias.  Skin: Negative for wound.  Neurological: Negative for syncope and numbness.     Physical Exam Updated Vital Signs BP 106/67 (BP Location: Left Arm)   Pulse 82   Temp 98.2 F (36.8 C) (Oral)   Resp 16   Ht 5\' 7"  (1.702 m)   Wt 59 kg (130 lb)   LMP 05/24/2018   SpO2 100%   BMI 20.36 kg/m   Physical Exam  Constitutional: She is oriented to person, place, and time. She appears well-developed and well-nourished. No distress.  HENT:  Head: Normocephalic and atraumatic.  Eyes: Conjunctivae are normal. Right eye exhibits no discharge. Left eye exhibits no discharge. No scleral icterus.  Cardiovascular: Normal rate, regular rhythm, normal heart sounds and intact distal pulses. Exam reveals no gallop and no friction rub.  No murmur heard. Pulmonary/Chest: Effort normal. No stridor. No respiratory distress. She  has no wheezes.  Musculoskeletal:  Swelling and deformity noted to left wrist with tenderness on the radial aspect to distal third of the forearm, anatomical snuffbox, as well as the MCP of the left thumb; sensation intact of the digits, full range of motion, no tenderness to the elbow or shoulder extending; radial pulse intact, cap refill less than 2 seconds No tenderness on palpation of remaining extremities, cervical, thoracic, or lumbar spine  Neurological: She is alert and oriented to person, place, and time. Coordination normal.  Skin: Skin is warm and dry. No rash noted. She is not diaphoretic. No pallor.  Psychiatric: She has a normal mood and affect. Her behavior is normal. Judgment and thought content normal.  Nursing note and vitals reviewed.    ED  Treatments / Results  Labs (all labs ordered are listed, but only abnormal results are displayed) Labs Reviewed - No data to display  EKG None  Radiology Dg Wrist Complete Left  Result Date: 06/03/2018 CLINICAL DATA:  Larey Seat from ladder pain to the left wrist EXAM: LEFT WRIST - COMPLETE 3+ VIEW COMPARISON:  None. FINDINGS: Acute comminuted intra-articular fracture of the distal radius. Minimal dorsal displacement of distal fracture fragments. Fracture is impacted. No dislocation. IMPRESSION: Acute comminuted, impacted intra-articular distal radius fracture Electronically Signed   By: Jasmine Pang M.D.   On: 06/03/2018 19:29   Dg Hand Complete Left  Result Date: 06/03/2018 CLINICAL DATA:  Larey Seat off ladder EXAM: LEFT HAND - COMPLETE 3+ VIEW COMPARISON:  None. FINDINGS: Acute impacted intra-articular distal radius fracture. No acute fracture or malalignment within the digits of the hand. Soft tissue swelling about the wrist. IMPRESSION: Acute intra-articular distal radius fracture Electronically Signed   By: Jasmine Pang M.D.   On: 06/03/2018 19:30    Procedures Procedures (including critical care time) SPLINT APPLICATION Date/Time: 7:38 PM Authorized by: Emi Holes Consent: Verbal consent obtained. Risks and benefits: risks, benefits and alternatives were discussed Consent given by: patient Splint applied by: EMT Location details: L wrist Splint type: volar short arm Supplies used: orthoglass, ACE wrap Post-procedure: The splinted body part was neurovascularly unchanged following the procedure. Patient tolerance: Patient tolerated the procedure well with no immediate complications.    Medications Ordered in ED Medications  HYDROcodone-acetaminophen (NORCO/VICODIN) 5-325 MG per tablet 2 tablet (2 tablets Oral Given 06/03/18 1923)     Initial Impression / Assessment and Plan / ED Course  I have reviewed the triage vital signs and the nursing notes.  Pertinent labs & imaging  results that were available during my care of the patient were reviewed by me and considered in my medical decision making (see chart for details).     Patient with acute comminuted, impacted intra-articular LEFT distal radius fracture seen on x-ray. X-rays also reviewed by me. Patient is neurovascularly intact.  Patient splinted in the ED.  Will refer to hand surgery, Dr. Melvyn Novas, for further evaluation.  Will discharge home with short course of Norco and ibuprofen for pain control.  I reviewed the [place] narcotic database and found no discrepancies.  Patient understands and agrees with plan.  Patient vitals stable throughout ED course and discharged in satisfactory condition.  Final Clinical Impressions(s) / ED Diagnoses   Final diagnoses:  Closed fracture of left wrist, initial encounter    ED Discharge Orders        Ordered    HYDROcodone-acetaminophen (NORCO/VICODIN) 5-325 MG tablet  Every 6 hours PRN     06/03/18 1932  ibuprofen (ADVIL,MOTRIN) 600 MG tablet  Every 6 hours PRN     06/03/18 1932       Emi Holes, PA-C 06/03/18 Bishop Limbo, MD 06/07/18 1234

## 2018-06-03 NOTE — ED Triage Notes (Signed)
Pt injured left wrist from fall from ladder-denies head/neck pain/injury-cardboard splint applied by EMT

## 2018-06-03 NOTE — ED Notes (Signed)
Swelling and pain to left wrist after falling from a ladder, able to move all fingers, radial pulse is strong.

## 2018-06-03 NOTE — Discharge Instructions (Addendum)
Medications: Norco, ibuprofen  Treatment: Take ibuprofen every 6 hours as prescribed.  For breakthrough pain, take 1-2 Norco every 6 hours.  You can use ice through your  splint 3-4 times daily alternating 20 minutes on, 20 minutes off.  Keep your splint applied until you see the hand surgeon.  Make sure he does not get wet.  Please return to the emergency department if it does get wet for replacement.  Follow-up: Please follow-up with Dr. Melvyn Novasrtmann, the hand doctor, for further evaluation and treatment of your fracture.  Please return the emergency department if you develop any new or worsening symptoms.  Do not drink alcohol, drive, operate machinery or participate in any other potentially dangerous activities while taking opiate pain medication as it may make you sleepy. Do not take this medication with any other sedating medications, either prescription or over-the-counter. If you were prescribed Percocet or Vicodin, do not take these with acetaminophen (Tylenol) as it is already contained within these medications and overdose of Tylenol is dangerous.   This medication is an opiate (or narcotic) pain medication and can be habit forming.  Use it as little as possible to achieve adequate pain control.  Do not use or use it with extreme caution if you have a history of opiate abuse or dependence. This medication is intended for your use only - do not give any to anyone else and keep it in a secure place where nobody else, especially children, have access to it. It will also cause or worsen constipation, so you may want to consider taking an over-the-counter stool softener while you are taking this medication.

## 2019-01-19 IMAGING — CR DG HAND COMPLETE 3+V*L*
3 series · 3 of 3 positions shown · non-contrast
Comparison: None.

CLINICAL DATA: Fell off ladder

EXAM:
LEFT HAND - COMPLETE 3+ VIEW

[x hand pa left]
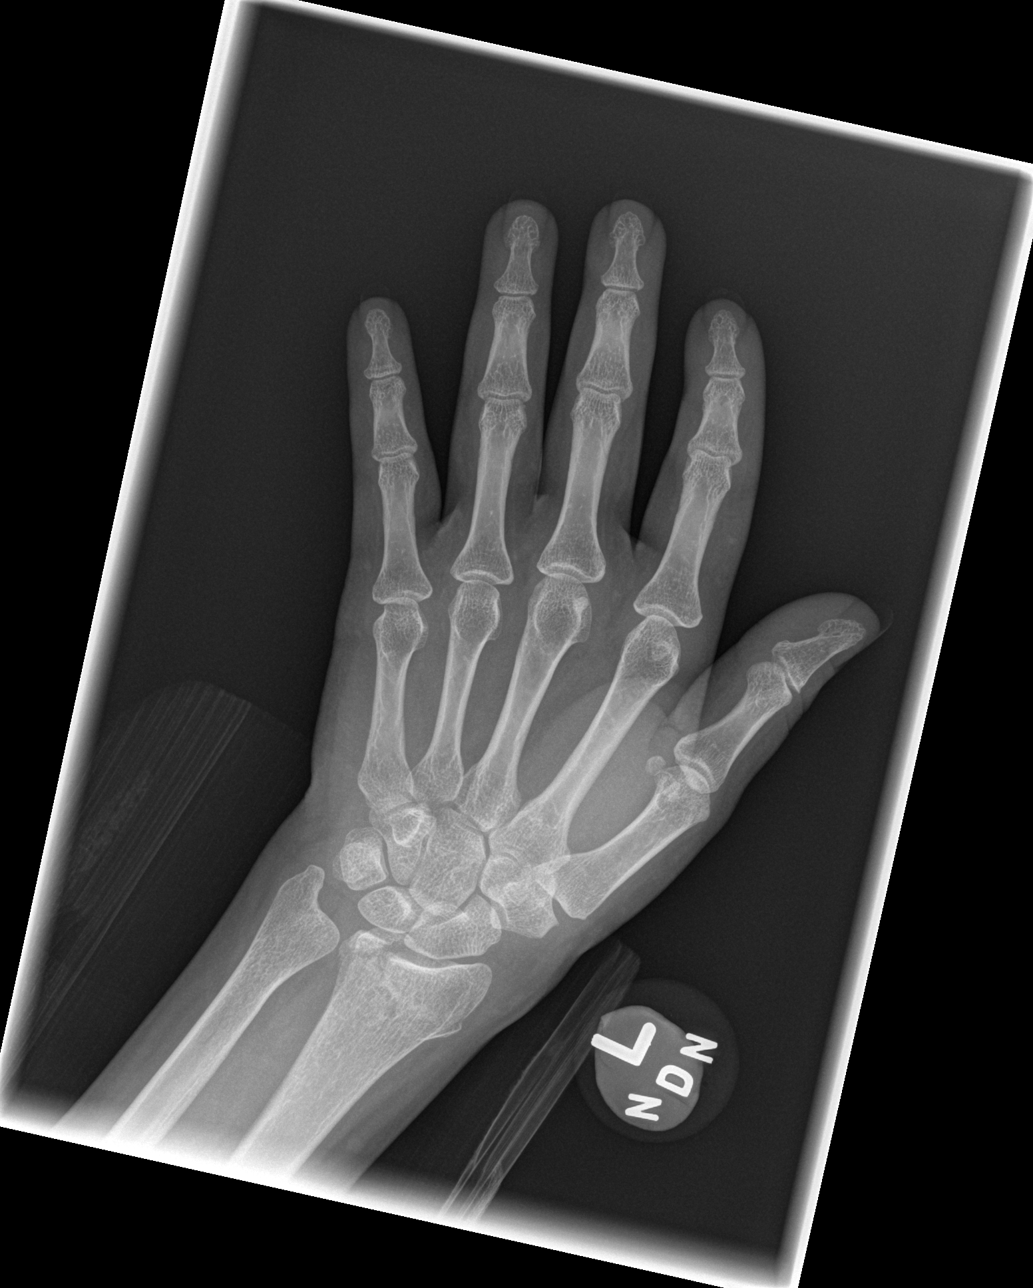

[x hand oblique left]
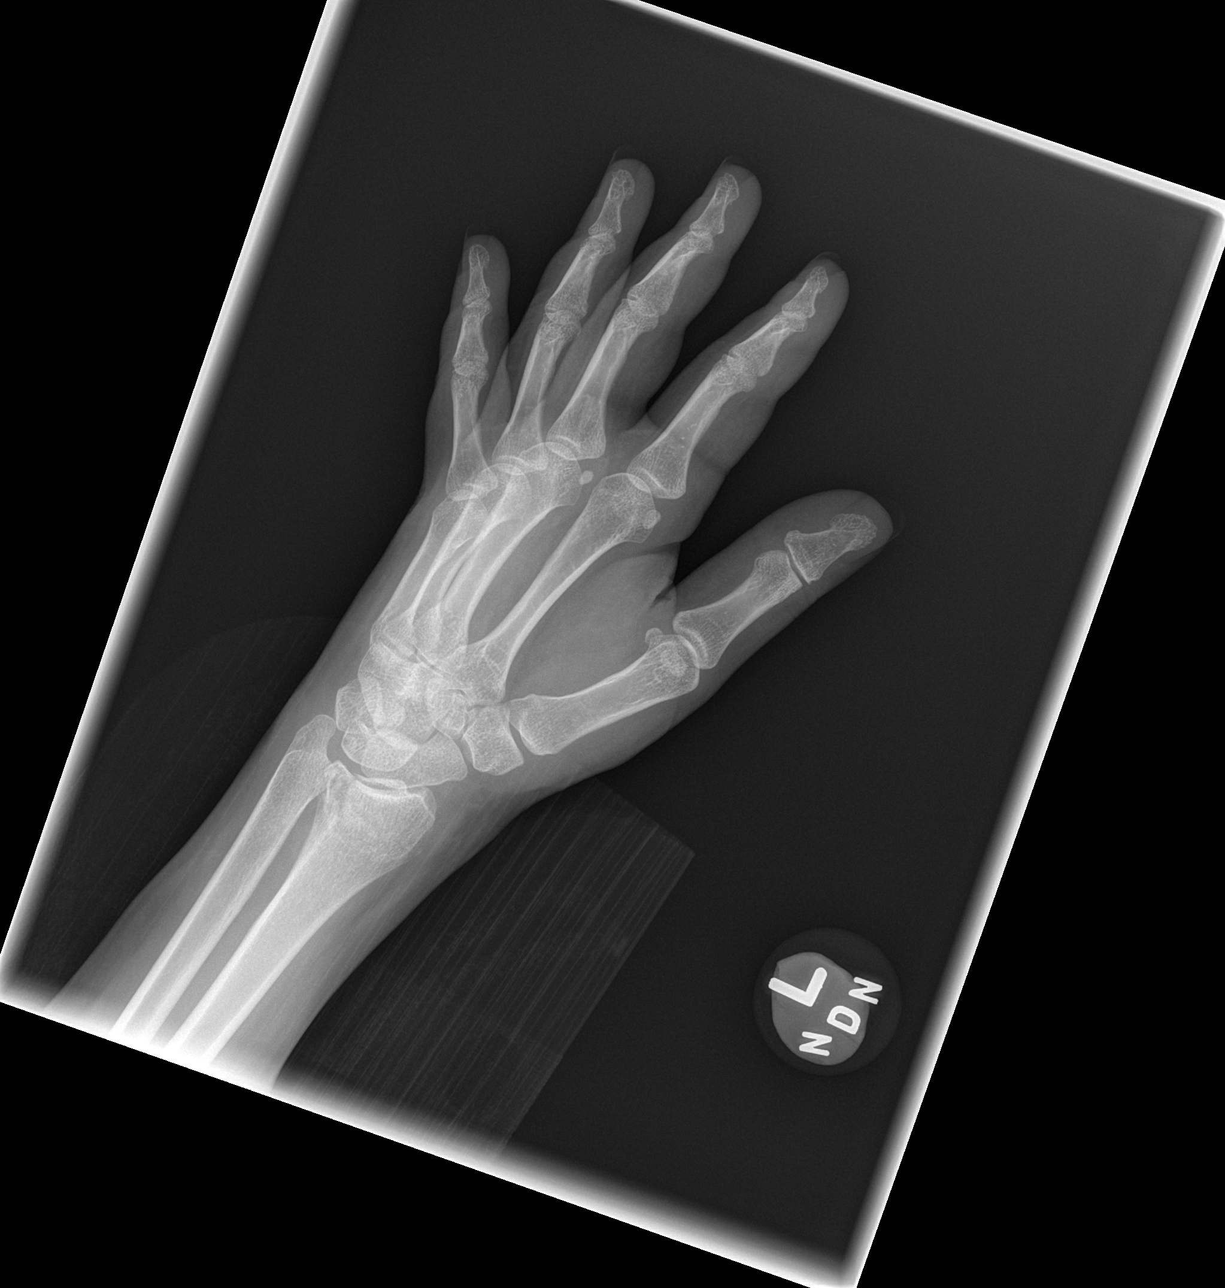

[x hand lat left]
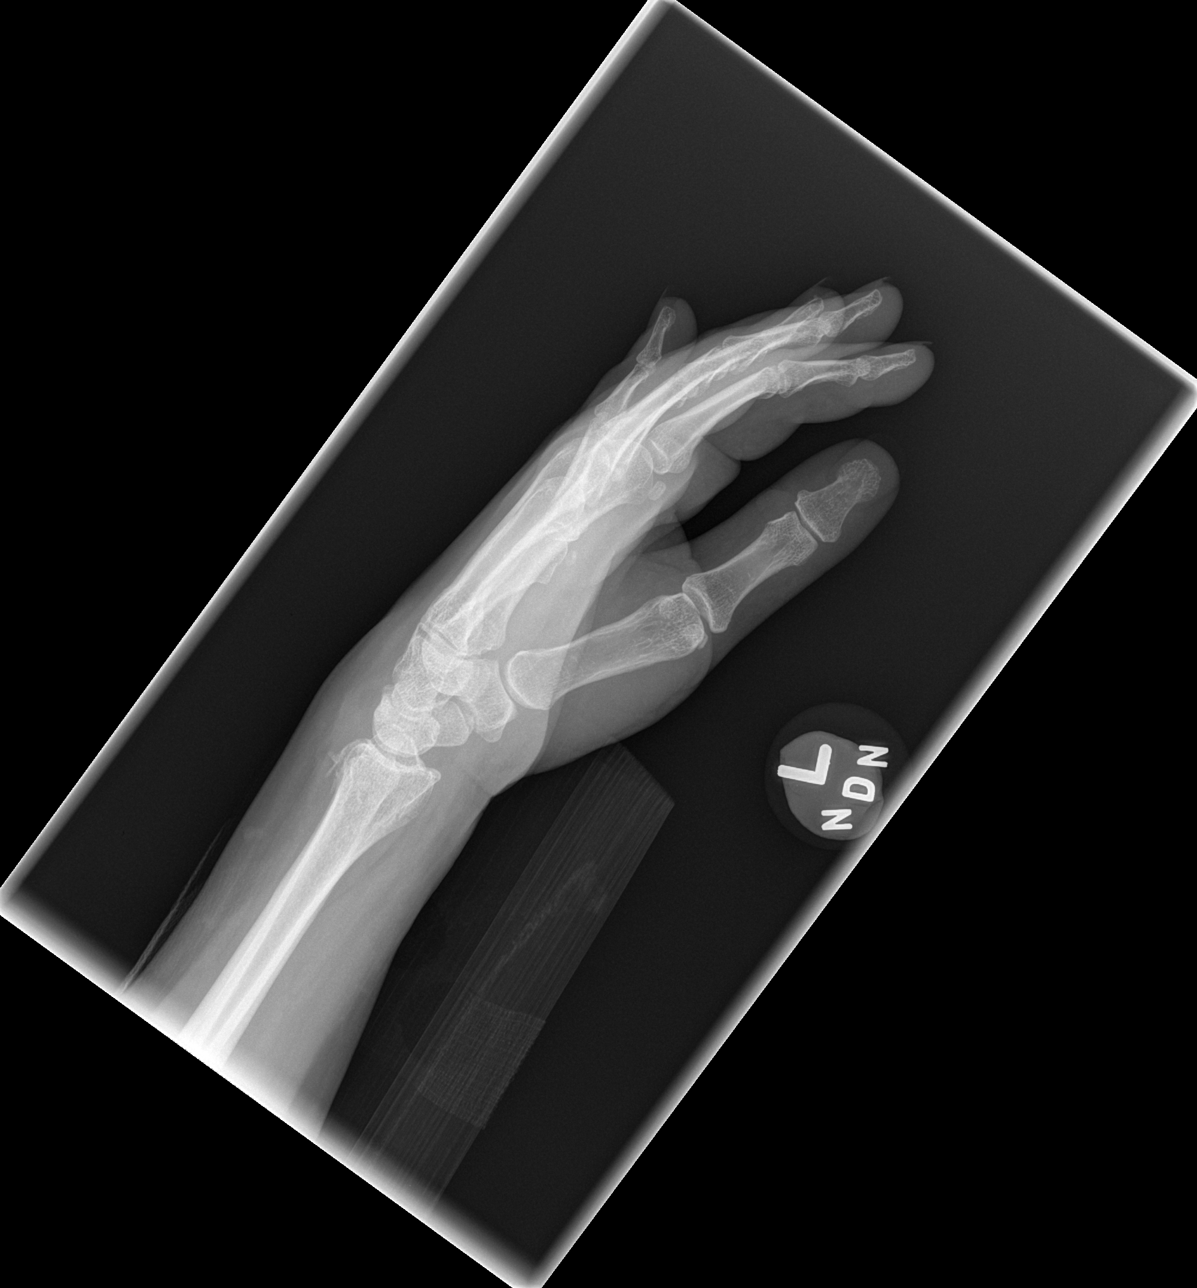

[3 of 3 positions shown; findings below may reference images not displayed]

FINDINGS: Acute impacted intra-articular distal radius fracture. No acute
fracture or malalignment within the digits of the hand. Soft tissue
swelling about the wrist.
IMPRESSION: Acute intra-articular distal radius fracture

## 2021-05-04 ENCOUNTER — Encounter (HOSPITAL_BASED_OUTPATIENT_CLINIC_OR_DEPARTMENT_OTHER): Payer: Self-pay | Admitting: Emergency Medicine

## 2021-05-04 ENCOUNTER — Emergency Department (HOSPITAL_BASED_OUTPATIENT_CLINIC_OR_DEPARTMENT_OTHER)
Admission: EM | Admit: 2021-05-04 | Discharge: 2021-05-04 | Disposition: A | Discharge status: Home / Self Care | Payer: BLUE CROSS/BLUE SHIELD | Attending: Emergency Medicine | Admitting: Emergency Medicine

## 2021-05-04 ENCOUNTER — Other Ambulatory Visit: Payer: Self-pay

## 2021-05-04 DIAGNOSIS — Y990 Civilian activity done for income or pay: Secondary | ICD-10-CM | POA: Diagnosis not present

## 2021-05-04 DIAGNOSIS — S4992XA Unspecified injury of left shoulder and upper arm, initial encounter: Secondary | ICD-10-CM | POA: Diagnosis present

## 2021-05-04 DIAGNOSIS — S46912A Strain of unspecified muscle, fascia and tendon at shoulder and upper arm level, left arm, initial encounter: Secondary | ICD-10-CM | POA: Diagnosis not present

## 2021-05-04 DIAGNOSIS — X500XXA Overexertion from strenuous movement or load, initial encounter: Secondary | ICD-10-CM | POA: Diagnosis not present

## 2021-05-04 MED ORDER — KETOROLAC TROMETHAMINE 60 MG/2ML IM SOLN
60.0000 mg | Freq: Once | INTRAMUSCULAR | Status: AC
  Administered 2021-05-04: 60 mg via INTRAMUSCULAR
  Filled 2021-05-04: qty 2

## 2021-05-04 MED ORDER — CYCLOBENZAPRINE HCL 10 MG PO TABS: 10.0000 mg | ORAL_TABLET | Freq: Two times a day (BID) | ORAL | 0 refills | Status: AC | PRN

## 2021-05-04 MED ORDER — LIDOCAINE 5 % EX PTCH: 1.0000 | MEDICATED_PATCH | CUTANEOUS | 0 refills | Status: AC

## 2021-05-04 NOTE — ED Provider Notes (Signed)
[facility] EMERGENCY DEPARTMENT Provider Note   CSN: [id number] Arrival date & time: 05/04/21  1109     History Chief Complaint  Patient presents with  . Shoulder Pain    Cassandra Bright is a 40 y.o. female.  HPI      40 year old female with no significant medical history presents concern for shoulder pain.  Reports left shoulder pain for 3 days.  She tried ibuprofen at home with little relief.  She does lift at work, reports lifting heavier objects than usual. Works as a Nature conservation officer at [employer].  Pain has been worsening over the last 3 days. No trauma or falls. No numbness/weakness/chest pain, abdominal pain, nausea, vomiting or other concerns.  Denies fevers.  Reports pain that is more severe to the anterior shoulder, but also pain that when she turns her neck is radiates down the upper part of her shoulder through her trapezius.  Pain is worse with abduction of the shoulder, and shoulder abduction is limited due to this pain.  She is also been trying icy hot in addition to ibuprofen with little relief.  The pain has been slowly worsening, was more severe today.  Notes pain with light touch, even tapping with the fingers seems to make the pain worse in the shoulder. No rash.  Past Medical History:  Diagnosis Date  . Medical history non-contributory   . Postpartum care following vaginal delivery (11/20) 10/21/2011  . Postpartum care following vaginal delivery (8/28) 07/27/2013    Patient Active Problem List   Diagnosis Date Noted  . Postpartum care following vaginal delivery (8/28) 07/27/2013    Past Surgical History:  Procedure Laterality Date  . WRIST SURGERY Left      OB History    Gravida  4   Para  4   Term  4   Preterm  0   AB  0   Living  4     SAB  0   IAB  0   Ectopic  0   Multiple  0   Live Births  2           No family history on file.  Social History   Tobacco Use  . Smoking status: Never Smoker  . Smokeless tobacco: Never Used   Vaping Use  . Vaping Use: Never used  Substance Use Topics  . Alcohol use: No  . Drug use: No    Home Medications Prior to Admission medications   Medication Sig Start Date End Date Taking? Authorizing Provider  cyclobenzaprine (FLEXERIL) 10 MG tablet Take 1 tablet (10 mg total) by mouth 2 (two) times daily as needed for muscle spasms. 05/04/21  Yes Alvira Monday, MD  lidocaine (LIDODERM) 5 % Place 1 patch onto the skin daily. Remove & Discard patch within 12 hours or as directed by MD 05/04/21  Yes Alvira Monday, MD  HYDROcodone-acetaminophen (NORCO/VICODIN) 5-325 MG tablet Take 1-2 tablets by mouth every 6 (six) hours as needed. 06/03/18   Law, Waylan Boga, PA-C  ibuprofen (ADVIL,MOTRIN) 600 MG tablet Take 1 tablet (600 mg total) by mouth every 6 (six) hours as needed. 06/03/18   Emi Holes, PA-C  Prenatal Vit-Fe Fumarate-FA (PRENATAL MULTIVITAMIN) TABS tablet Take 1 tablet by mouth daily at 12 noon.    [provider]    Allergies    Patient has no known allergies.  Review of Systems   Review of Systems  Constitutional: Negative for fever.  HENT: Negative for sore throat.  Respiratory: Negative for cough and shortness of breath.   Cardiovascular: Negative for chest pain.  Gastrointestinal: Negative for abdominal pain, diarrhea, nausea and vomiting.  Musculoskeletal: Positive for arthralgias and neck pain. Negative for back pain.  Skin: Negative for rash.  Neurological: Negative for syncope.    Physical Exam Updated Vital Signs BP 113/78 (BP Location: Right Arm)   Pulse 77   Temp 97.7 F (36.5 C) (Oral)   Resp 18   Ht 5\' 7"  (1.702 m)   Wt 59 kg   LMP 04/27/2021   SpO2 100%   Breastfeeding No   BMI 20.36 kg/m   Physical Exam Vitals and nursing note reviewed.  Constitutional:      General: She is not in acute distress.    Appearance: Normal appearance. She is not ill-appearing, toxic-appearing or diaphoretic.  HENT:     Head: Normocephalic.  Eyes:      Conjunctiva/sclera: Conjunctivae normal.  Cardiovascular:     Rate and Rhythm: Normal rate and regular rhythm.     Pulses: Normal pulses.  Pulmonary:     Effort: Pulmonary effort is normal. No respiratory distress.  Musculoskeletal:        General: Tenderness (anterior shoulder near coronoid process, reports pain also to lateral shoulder ) present. No deformity or signs of injury.     Cervical back: No rigidity.     Comments: Limited shoulder abduction due to pain, able to flex, extend Normal pulses, normal strength and sensation distally  Skin:    General: Skin is warm and dry.     Coloration: Skin is not jaundiced or pale.  Neurological:     General: No focal deficit present.     Mental Status: She is alert and oriented to person, place, and time.     ED Results / Procedures / Treatments   Labs (all labs ordered are listed, but only abnormal results are displayed) Labs Reviewed - No data to display  EKG None  Radiology No results found.  Procedures Procedures   Medications Ordered in ED Medications  ketorolac (TORADOL) injection 60 mg (60 mg Intramuscular Given 05/04/21 1147)    ED Course  I have reviewed the triage vital signs and the nursing notes.  Pertinent labs & imaging results that were available during my care of the patient were reviewed by me and considered in my medical decision making (see chart for details).    MDM Rules/Calculators/A&P                         40 year old female with no significant medical history presents concern for shoulder pain.  Denies chest pain, dyspnea, abdominal pain and mental suspicion for intrathoracic or intra-abdominal etiology of symptoms.  Has tenderness on shoulder exam and there are pain with range of motion, specifically difficulty with abduction.  She is neurovascularly intact.  No fever, no trauma, low suspicion for septic arthritis, fracture or dislocation.  Discussed recommendations for MSK etiology of shoulder  pain, and that cervical radiculopathy is also on the differential, however initial management is the same, including prescription for flex Flexeril, lidocaine, recommend Tylenol and ibuprofen rotating and sports medicine follow-up. Does not appear to be dermatoma to suggest shingles without rash. Given shoulder sling, recommend range of motion active exercises.  Recommend rest. Patient discharged in stable condition with understanding of reasons to return.    Final Clinical Impression(s) / ED Diagnoses Final diagnoses:  Strain of left shoulder, initial encounter  Rx / DC Orders ED Discharge Orders         Ordered    cyclobenzaprine (FLEXERIL) 10 MG tablet  2 times daily PRN        05/04/21 1141    lidocaine (LIDODERM) 5 %  Every 24 hours        05/04/21 1141           Alvira Monday, MD 05/04/21 1155

## 2021-05-04 NOTE — ED Triage Notes (Signed)
Pt c/o left shoulder pain x 3 days, pt tried ibuprofen with little relief. Pt does heavy lifting at work, reports lifting heavier objects then usual.

## 2021-05-04 NOTE — Discharge Instructions (Signed)
You may take tylenol (acetaminophen) 1000 mg 4 times a day for 1 week. This is the maximum dose of Tylenol you can take from all sources. Please check other over-the-counter medications and prescriptions to ensure you are not taking other medications that contain acetaminophen.  You may also take ibuprofen 400 mg 6 times a day alternating with or at the same time as tylenol.
